# Patient Record
Sex: Male | Born: 1935 | Race: White | Hispanic: No | Marital: Married
Health system: Southern US, Community
[De-identification: ages and names within clinical notes are randomized; demographics above are authoritative.]

## PROBLEM LIST (undated history)

## (undated) DIAGNOSIS — E119 Type 2 diabetes mellitus without complications: Secondary | ICD-10-CM

## (undated) DIAGNOSIS — R296 Repeated falls: Secondary | ICD-10-CM

## (undated) DIAGNOSIS — F419 Anxiety disorder, unspecified: Secondary | ICD-10-CM

## (undated) DIAGNOSIS — N189 Chronic kidney disease, unspecified: Secondary | ICD-10-CM

## (undated) DIAGNOSIS — I10 Essential (primary) hypertension: Secondary | ICD-10-CM

## (undated) DIAGNOSIS — I5020 Unspecified systolic (congestive) heart failure: Secondary | ICD-10-CM

## (undated) DIAGNOSIS — I4891 Unspecified atrial fibrillation: Secondary | ICD-10-CM

---

## 2016-05-30 ENCOUNTER — Other Ambulatory Visit (HOSPITAL_COMMUNITY): Payer: Medicare Other

## 2016-05-30 ENCOUNTER — Inpatient Hospital Stay
Admission: AD | Admit: 2016-05-30 | Discharge: 2016-07-11 | Disposition: E | Payer: Medicare Other | Source: Ambulatory Visit | Attending: Internal Medicine | Admitting: Internal Medicine

## 2016-05-30 ENCOUNTER — Ambulatory Visit (HOSPITAL_COMMUNITY)
Admission: AD | Admit: 2016-05-30 | Discharge: 2016-05-30 | Disposition: A | Discharge status: Home / Self Care | Payer: Medicare Other | Source: Other Acute Inpatient Hospital | Attending: Internal Medicine | Admitting: Internal Medicine

## 2016-05-30 DIAGNOSIS — J9690 Respiratory failure, unspecified, unspecified whether with hypoxia or hypercapnia: Secondary | ICD-10-CM

## 2016-05-30 DIAGNOSIS — R4182 Altered mental status, unspecified: Secondary | ICD-10-CM

## 2016-05-30 DIAGNOSIS — Z931 Gastrostomy status: Secondary | ICD-10-CM

## 2016-05-30 DIAGNOSIS — R609 Edema, unspecified: Secondary | ICD-10-CM

## 2016-05-30 DIAGNOSIS — Z452 Encounter for adjustment and management of vascular access device: Secondary | ICD-10-CM

## 2016-05-30 HISTORY — DX: Unspecified atrial fibrillation: I48.91

## 2016-05-30 HISTORY — DX: Essential (primary) hypertension: I10

## 2016-05-30 HISTORY — DX: Repeated falls: R29.6

## 2016-05-30 HISTORY — DX: Chronic kidney disease, unspecified: N18.9

## 2016-05-30 HISTORY — DX: Unspecified systolic (congestive) heart failure: I50.20

## 2016-05-30 HISTORY — DX: Type 2 diabetes mellitus without complications: E11.9

## 2016-05-30 HISTORY — DX: Anxiety disorder, unspecified: F41.9

## 2016-05-30 LAB — COMPREHENSIVE METABOLIC PANEL
ALK PHOS: 65 U/L (ref 38–126)
ALT: 48 U/L (ref 17–63)
AST: 27 U/L (ref 15–41)
Albumin: 2.4 g/dL — ABNORMAL LOW (ref 3.5–5.0)
Anion gap: 7 (ref 5–15)
BILIRUBIN TOTAL: 1 mg/dL (ref 0.3–1.2)
BUN: 35 mg/dL — AB (ref 6–20)
CO2: 28 mmol/L (ref 22–32)
CREATININE: 1.56 mg/dL — AB (ref 0.61–1.24)
Calcium: 8.3 mg/dL — ABNORMAL LOW (ref 8.9–10.3)
Chloride: 109 mmol/L (ref 101–111)
GFR calc Af Amer: 45 mL/min — ABNORMAL LOW (ref >60)
GFR, EST NON AFRICAN AMERICAN: 39 mL/min — AB (ref >60)
Glucose, Bld: 164 mg/dL — ABNORMAL HIGH (ref 65–99)
Potassium: 3.3 mmol/L — ABNORMAL LOW (ref 3.5–5.1)
Sodium: 144 mmol/L (ref 135–145)
TOTAL PROTEIN: 5.3 g/dL — AB (ref 6.5–8.1)

## 2016-05-30 LAB — BLOOD GAS, ARTERIAL
Acid-Base Excess: 4 mmol/L — ABNORMAL HIGH (ref 0.0–2.0)
BICARBONATE: 27.8 mmol/L (ref 20.0–28.0)
Drawn by: 257701
FIO2: 40
LHR: 14 {breaths}/min
MECHVT: 500 mL
O2 Saturation: 94.7 %
PATIENT TEMPERATURE: 98.6
PEEP: 5 cmH2O
pCO2 arterial: 40.2 mmHg (ref 32.0–48.0)
pH, Arterial: 7.455 — ABNORMAL HIGH (ref 7.350–7.450)
pO2, Arterial: 73.6 mmHg — ABNORMAL LOW (ref 83.0–108.0)

## 2016-05-30 LAB — CBC
HCT: 23.1 % — ABNORMAL LOW (ref 39.0–52.0)
Hemoglobin: 7.7 g/dL — ABNORMAL LOW (ref 13.0–17.0)
MCH: 30.6 pg (ref 26.0–34.0)
MCHC: 33.3 g/dL (ref 30.0–36.0)
MCV: 91.7 fL (ref 78.0–100.0)
Platelets: 245 10*3/uL (ref 150–400)
RBC: 2.52 MIL/uL — ABNORMAL LOW (ref 4.22–5.81)
RDW: 15.1 % (ref 11.5–15.5)
WBC: 10.2 10*3/uL (ref 4.0–10.5)

## 2016-05-31 LAB — TSH: TSH: 0.116 u[IU]/mL — AB (ref 0.350–4.500)

## 2016-05-31 LAB — URINALYSIS, ROUTINE W REFLEX MICROSCOPIC
BILIRUBIN URINE: NEGATIVE
Glucose, UA: NEGATIVE mg/dL
KETONES UR: 5 mg/dL — AB
NITRITE: NEGATIVE
PROTEIN: 30 mg/dL — AB
SPECIFIC GRAVITY, URINE: 1.018 (ref 1.005–1.030)
pH: 5 (ref 5.0–8.0)

## 2016-06-01 LAB — POTASSIUM: Potassium: 3.2 mmol/L — ABNORMAL LOW (ref 3.5–5.1)

## 2016-06-01 LAB — URINE CULTURE: CULTURE: NO GROWTH

## 2016-06-02 LAB — POTASSIUM: POTASSIUM: 3.2 mmol/L — AB (ref 3.5–5.1)

## 2016-06-03 LAB — URINALYSIS, ROUTINE W REFLEX MICROSCOPIC
BILIRUBIN URINE: NEGATIVE
GLUCOSE, UA: 50 mg/dL — AB
Ketones, ur: NEGATIVE mg/dL
NITRITE: NEGATIVE
Protein, ur: 30 mg/dL — AB
Specific Gravity, Urine: 1.012 (ref 1.005–1.030)
pH: 5 (ref 5.0–8.0)

## 2016-06-03 LAB — POTASSIUM: Potassium: 3.5 mmol/L (ref 3.5–5.1)

## 2016-06-04 ENCOUNTER — Encounter: Payer: Self-pay | Admitting: Pulmonary Disease

## 2016-06-04 DIAGNOSIS — Z930 Tracheostomy status: Secondary | ICD-10-CM

## 2016-06-04 DIAGNOSIS — J9601 Acute respiratory failure with hypoxia: Secondary | ICD-10-CM

## 2016-06-04 DIAGNOSIS — R4020 Unspecified coma: Secondary | ICD-10-CM

## 2016-06-04 DIAGNOSIS — R0902 Hypoxemia: Secondary | ICD-10-CM | POA: Diagnosis not present

## 2016-06-04 NOTE — Consult Note (Signed)
Name: Ryan Griffin MRN: [id number] DOB: 10-02-1929    ADMISSION DATE:  05/27/2016 CONSULTATION DATE:  06/04/16  REFERRING MD :  Dr. Sharyon MedicusHijazi   CHIEF COMPLAINT:  Respiratory Failure    HISTORY OF PRESENT ILLNESS:  82 y/o M with a PMH of anxiety, HTN, AF (previously on anticoagulation), systolic CHF, CKD, DM and recurrent falls who was admitted to [facility] after a mechanical fall suffering a scalp hematoma, laceration fo the left eye and closed odontoid fracture.  He was seen by NSGY and they opted for conservative management of the odontoid fracture with a C-Collar.  Hospital course was complicated by difficult to control pain and agitated delirium. He was treated with muscle relaxants and haldol.  Course further complicated by a PEA arrest requiring ALCS and intubation.  HE was transferred to ICU.  CT of the head was reportedly negative at that time. He developed shock requiring vasopressors.  There was concern for possible aspiration PNA and he was treated with Unasyn.  ECHO showed moderate AS which was a new finding.  He slowly improved and passed an SBT on 12/13 but failed extubation and was reintubated.  He completed 10 days of antibiotics for pneumonia and E-Coli UTI.  The patient was ultimately trached and a PEG was placed.  He was subsequently transferred to [facility] [place] on 12/21 for further vent weaning.    PCCM consulted for evaluation.   PAST MEDICAL HISTORY :   has a past medical history of Anxiety; Atrial fibrillation (HCC); CKD (chronic kidney disease); Diabetes (HCC); Hypertension; Recurrent falls; and Systolic CHF (HCC).   has no past surgical history on file.  MEDICATIONS: PRIOR TO ADMIT Baclofen  Lasix  Klonopin  Norvasc Cefdinir  Lantus Venlafaxine  Seroquel  Propofol  Timolol  ALLERGIES:  Testosterone  Cosopt  Meperidine Procainamide Promethazine  Tylenol  Darvocet  FAMILY HISTORY:  family history is not on file. > unable to obtain.    SOCIAL HISTORY:    REVIEW OF SYSTEMS:  Unable to complete as patient is altered on mechanical ventilation.  Information obtained from previous documentation and staff at bedside.   SUBJECTIVE:  Failed weaning this AM, less responsive.  VITAL SIGNS: BP: ()/()  Arterial Line BP: ()/()  HR 87, RR 16, BP 148/76 and sat of 95% on A/C.  PHYSICAL EXAMINATION: General:  Acute on chronically ill appearing male, unresponsive (unsure of baseline here). Neuro:  Unresponsive, does not move any ext to command or pain. HEENT:  Traumatic skull with multiple bruises, c-collar in place. Cardiovascular:  RRR, Nl S1/S2, -M/R/G. Lungs:  Coarse BS diffusely Abdomen:  Soft, NT, ND and +BS. Musculoskeletal:  -edema and -tenderness. Skin:  Intact   Last Labs    Recent Labs Lab 05/16/2016 1930 06/01/16 0530 06/02/16 0559 06/03/16 0500  NA 144  --   --   --   K 3.3* 3.2* 3.2* 3.5  CL 109  --   --   --   CO2 28  --   --   --   BUN 35*  --   --   --   CREATININE 1.56*  --   --   --   GLUCOSE 164*  --   --   --       Last Labs    Recent Labs Lab 05/17/2016 1930  HGB 7.7*  HCT 23.1*  WBC 10.2  PLT 245     Imaging Results (Last 48 hours)  No results found.  ASSESSMENT / PLAN:  82 year old male with PMH of CHF, a-fib and diabetes who presents from outside facility after suffering a fall (evidently a recurrent issue) who has CKD with unknown baseline.  After fall, noted to have multiple fractures (namely odontoid fracture) and NS elected conservative management with a  c-collar.  Patient was on multiple muscle relaxants and narcotics.  Developed cardiac arrest and was intubated.  Subsequently developed UTI and with failure to extubate, patient was trached and [facility].  I reviewed last CXR from 12/21 myself, pulmonary edema noted.  Trach in place but appears positional.  Discussed with RT and PCCM-NP.  Acute respiratory failure:             - Continue  weaning, target is 12 hours of high PS today but failed with 3 hours only.             - Lasix to keep dry.             - Minimize sedation as able  Trach status:             - Maintain current size             - Do not downsize or use uncuffed.  Hypoxemia:             - Titrate O2 for sat of 88-92%  Comatose:             - May need neurology to evaluate and prognosticate             - Consider EEG or CT, will defer to primary  GOC:             - May consider involvement of palliative care as I believe care provided here is futile  Patient seen and examined, agree with above note.  I dictated the care and orders written for this patient under my direction.  Alyson ReedyWesam G Najae Rathert, MD [phone]   06/04/2016, 4:07 PM

## 2016-06-05 LAB — URINE CULTURE: Culture: >=100000 — AB

## 2016-06-06 LAB — CBC
HCT: 24.3 % — ABNORMAL LOW (ref 39.0–52.0)
HEMATOCRIT: 24.8 % — AB (ref 39.0–52.0)
HEMOGLOBIN: 7.8 g/dL — AB (ref 13.0–17.0)
Hemoglobin: 7.7 g/dL — ABNORMAL LOW (ref 13.0–17.0)
MCH: 29 pg (ref 26.0–34.0)
MCH: 29.8 pg (ref 26.0–34.0)
MCHC: 31.5 g/dL (ref 30.0–36.0)
MCHC: 31.7 g/dL (ref 30.0–36.0)
MCV: 92.2 fL (ref 78.0–100.0)
MCV: 94.2 fL (ref 78.0–100.0)
PLATELETS: 303 10*3/uL (ref 150–400)
Platelets: 281 10*3/uL (ref 150–400)
RBC: 2.69 MIL/uL — ABNORMAL LOW (ref 4.22–5.81)
RBC: 2.58 MIL/uL — AB (ref 4.22–5.81)
RDW: 15.1 % (ref 11.5–15.5)
RDW: 15.6 % — ABNORMAL HIGH (ref 11.5–15.5)
WBC: 7.4 10*3/uL (ref 4.0–10.5)
WBC: 10 10*3/uL (ref 4.0–10.5)

## 2016-06-06 LAB — BASIC METABOLIC PANEL
Anion gap: 12 (ref 5–15)
BUN: 79 mg/dL — ABNORMAL HIGH (ref 6–20)
CHLORIDE: 102 mmol/L (ref 101–111)
CO2: 28 mmol/L (ref 22–32)
CREATININE: 2.16 mg/dL — AB (ref 0.61–1.24)
Calcium: 8.8 mg/dL — ABNORMAL LOW (ref 8.9–10.3)
GFR calc non Af Amer: 26 mL/min — ABNORMAL LOW (ref >60)
GFR, EST AFRICAN AMERICAN: 31 mL/min — AB (ref >60)
GLUCOSE: 397 mg/dL — AB (ref 65–99)
Potassium: 3.6 mmol/L (ref 3.5–5.1)
Sodium: 142 mmol/L (ref 135–145)

## 2016-06-07 LAB — CBC
HCT: 23.4 % — ABNORMAL LOW (ref 39.0–52.0)
HCT: 20.3 % — ABNORMAL LOW (ref 39.0–52.0)
Hemoglobin: 7.3 g/dL — ABNORMAL LOW (ref 13.0–17.0)
Hemoglobin: 6.4 g/dL — CL (ref 13.0–17.0)
MCH: 29.3 pg (ref 26.0–34.0)
MCH: 28.7 pg (ref 26.0–34.0)
MCHC: 31.2 g/dL (ref 30.0–36.0)
MCHC: 31.5 g/dL (ref 30.0–36.0)
MCV: 94 fL (ref 78.0–100.0)
MCV: 91 fL (ref 78.0–100.0)
PLATELETS: 297 10*3/uL (ref 150–400)
PLATELETS: 287 10*3/uL (ref 150–400)
RBC: 2.49 MIL/uL — AB (ref 4.22–5.81)
RBC: 2.23 MIL/uL — ABNORMAL LOW (ref 4.22–5.81)
RDW: 15.4 % (ref 11.5–15.5)
RDW: 15.1 % (ref 11.5–15.5)
WBC: 7.9 10*3/uL (ref 4.0–10.5)
WBC: 6.2 10*3/uL (ref 4.0–10.5)

## 2016-06-07 LAB — BASIC METABOLIC PANEL
Anion gap: 12 (ref 5–15)
BUN: 94 mg/dL — AB (ref 6–20)
CO2: 27 mmol/L (ref 22–32)
CREATININE: 2.51 mg/dL — AB (ref 0.61–1.24)
Calcium: 8.5 mg/dL — ABNORMAL LOW (ref 8.9–10.3)
Chloride: 107 mmol/L (ref 101–111)
GFR calc Af Amer: 25 mL/min — ABNORMAL LOW (ref >60)
GFR, EST NON AFRICAN AMERICAN: 22 mL/min — AB (ref >60)
Glucose, Bld: 237 mg/dL — ABNORMAL HIGH (ref 65–99)
Potassium: 3.2 mmol/L — ABNORMAL LOW (ref 3.5–5.1)
SODIUM: 146 mmol/L — AB (ref 135–145)

## 2016-06-08 LAB — CBC
HEMATOCRIT: 23.3 % — AB (ref 39.0–52.0)
HEMOGLOBIN: 7.4 g/dL — AB (ref 13.0–17.0)
MCH: 29.1 pg (ref 26.0–34.0)
MCHC: 31.8 g/dL (ref 30.0–36.0)
MCV: 91.7 fL (ref 78.0–100.0)
Platelets: 341 10*3/uL (ref 150–400)
RBC: 2.54 MIL/uL — ABNORMAL LOW (ref 4.22–5.81)
RDW: 15.3 % (ref 11.5–15.5)
WBC: 8.3 10*3/uL (ref 4.0–10.5)

## 2016-06-08 LAB — BASIC METABOLIC PANEL
Anion gap: 11 (ref 5–15)
BUN: 109 mg/dL — AB (ref 6–20)
CHLORIDE: 108 mmol/L (ref 101–111)
CO2: 28 mmol/L (ref 22–32)
CREATININE: 2.84 mg/dL — AB (ref 0.61–1.24)
Calcium: 8.9 mg/dL (ref 8.9–10.3)
GFR calc Af Amer: 22 mL/min — ABNORMAL LOW (ref >60)
GFR calc non Af Amer: 19 mL/min — ABNORMAL LOW (ref >60)
GLUCOSE: 190 mg/dL — AB (ref 65–99)
Potassium: 3.3 mmol/L — ABNORMAL LOW (ref 3.5–5.1)
SODIUM: 147 mmol/L — AB (ref 135–145)

## 2016-06-09 LAB — CBC
HEMATOCRIT: 23.7 % — AB (ref 39.0–52.0)
Hemoglobin: 7.3 g/dL — ABNORMAL LOW (ref 13.0–17.0)
MCH: 29.4 pg (ref 26.0–34.0)
MCHC: 30.8 g/dL (ref 30.0–36.0)
MCV: 95.6 fL (ref 78.0–100.0)
Platelets: 313 10*3/uL (ref 150–400)
RBC: 2.48 MIL/uL — ABNORMAL LOW (ref 4.22–5.81)
RDW: 16 % — AB (ref 11.5–15.5)
WBC: 8.7 10*3/uL (ref 4.0–10.5)

## 2016-06-09 LAB — BASIC METABOLIC PANEL
Anion gap: 8 (ref 5–15)
BUN: 105 mg/dL — ABNORMAL HIGH (ref 6–20)
CO2: 31 mmol/L (ref 22–32)
CREATININE: 2.64 mg/dL — AB (ref 0.61–1.24)
Calcium: 8.7 mg/dL — ABNORMAL LOW (ref 8.9–10.3)
Chloride: 110 mmol/L (ref 101–111)
GFR calc non Af Amer: 21 mL/min — ABNORMAL LOW (ref >60)
GFR, EST AFRICAN AMERICAN: 24 mL/min — AB (ref >60)
Glucose, Bld: 146 mg/dL — ABNORMAL HIGH (ref 65–99)
Potassium: 3.6 mmol/L (ref 3.5–5.1)
SODIUM: 149 mmol/L — AB (ref 135–145)

## 2016-06-10 LAB — BASIC METABOLIC PANEL
Anion gap: 10 (ref 5–15)
BUN: 93 mg/dL — ABNORMAL HIGH (ref 6–20)
CALCIUM: 8.8 mg/dL — AB (ref 8.9–10.3)
CO2: 30 mmol/L (ref 22–32)
CREATININE: 2.43 mg/dL — AB (ref 0.61–1.24)
Chloride: 111 mmol/L (ref 101–111)
GFR calc non Af Amer: 23 mL/min — ABNORMAL LOW (ref >60)
GFR, EST AFRICAN AMERICAN: 27 mL/min — AB (ref >60)
Glucose, Bld: 261 mg/dL — ABNORMAL HIGH (ref 65–99)
Potassium: 3.2 mmol/L — ABNORMAL LOW (ref 3.5–5.1)
SODIUM: 151 mmol/L — AB (ref 135–145)

## 2016-06-10 LAB — CBC
HEMATOCRIT: 21.9 % — AB (ref 39.0–52.0)
Hemoglobin: 6.8 g/dL — CL (ref 13.0–17.0)
MCH: 30 pg (ref 26.0–34.0)
MCHC: 31.1 g/dL (ref 30.0–36.0)
MCV: 96.5 fL (ref 78.0–100.0)
Platelets: 265 10*3/uL (ref 150–400)
RBC: 2.27 MIL/uL — ABNORMAL LOW (ref 4.22–5.81)
RDW: 16.3 % — ABNORMAL HIGH (ref 11.5–15.5)
WBC: 7.5 10*3/uL (ref 4.0–10.5)

## 2016-06-10 LAB — ABO/RH: ABO/RH(D): O POS

## 2016-06-10 LAB — PREPARE RBC (CROSSMATCH)

## 2016-06-10 DEATH — deceased

## 2016-06-11 ENCOUNTER — Other Ambulatory Visit (HOSPITAL_COMMUNITY): Payer: Medicare Other

## 2016-06-11 LAB — BASIC METABOLIC PANEL
Anion gap: 6 (ref 5–15)
Anion gap: 5 (ref 5–15)
BUN: 69 mg/dL — AB (ref 6–20)
BUN: 71 mg/dL — AB (ref 6–20)
CHLORIDE: 110 mmol/L (ref 101–111)
CHLORIDE: 113 mmol/L — AB (ref 101–111)
CO2: 27 mmol/L (ref 22–32)
CO2: 29 mmol/L (ref 22–32)
Calcium: 8 mg/dL — ABNORMAL LOW (ref 8.9–10.3)
Calcium: 8.3 mg/dL — ABNORMAL LOW (ref 8.9–10.3)
Creatinine, Ser: 1.99 mg/dL — ABNORMAL HIGH (ref 0.61–1.24)
Creatinine, Ser: 1.94 mg/dL — ABNORMAL HIGH (ref 0.61–1.24)
GFR calc Af Amer: 34 mL/min — ABNORMAL LOW (ref >60)
GFR calc Af Amer: 35 mL/min — ABNORMAL LOW (ref >60)
GFR calc non Af Amer: 29 mL/min — ABNORMAL LOW (ref >60)
GFR calc non Af Amer: 30 mL/min — ABNORMAL LOW (ref >60)
Glucose, Bld: 342 mg/dL — ABNORMAL HIGH (ref 65–99)
Glucose, Bld: 130 mg/dL — ABNORMAL HIGH (ref 65–99)
POTASSIUM: 3.2 mmol/L — AB (ref 3.5–5.1)
Potassium: 3.2 mmol/L — ABNORMAL LOW (ref 3.5–5.1)
SODIUM: 147 mmol/L — AB (ref 135–145)
Sodium: 143 mmol/L (ref 135–145)

## 2016-06-11 LAB — TYPE AND SCREEN
Blood Product Expiration Date: 201801082359
ISSUE DATE / TIME: 201801011713
Unit Type and Rh: 5100

## 2016-06-11 LAB — CBC
HCT: 24.4 % — ABNORMAL LOW (ref 39.0–52.0)
HCT: 26.6 % — ABNORMAL LOW (ref 39.0–52.0)
Hemoglobin: 7.6 g/dL — ABNORMAL LOW (ref 13.0–17.0)
Hemoglobin: 8.2 g/dL — ABNORMAL LOW (ref 13.0–17.0)
MCH: 29.2 pg (ref 26.0–34.0)
MCH: 29.6 pg (ref 26.0–34.0)
MCHC: 31.1 g/dL (ref 30.0–36.0)
MCHC: 30.8 g/dL (ref 30.0–36.0)
MCV: 93.8 fL (ref 78.0–100.0)
MCV: 96 fL (ref 78.0–100.0)
PLATELETS: 257 10*3/uL (ref 150–400)
Platelets: 264 10*3/uL (ref 150–400)
RBC: 2.6 MIL/uL — AB (ref 4.22–5.81)
RBC: 2.77 MIL/uL — ABNORMAL LOW (ref 4.22–5.81)
RDW: 16.6 % — ABNORMAL HIGH (ref 11.5–15.5)
RDW: 17.1 % — AB (ref 11.5–15.5)
WBC: 6.1 10*3/uL (ref 4.0–10.5)
WBC: 5.8 10*3/uL (ref 4.0–10.5)

## 2016-06-14 ENCOUNTER — Other Ambulatory Visit (HOSPITAL_COMMUNITY): Payer: Medicare Other

## 2016-06-14 LAB — BASIC METABOLIC PANEL
Anion gap: 7 (ref 5–15)
BUN: 42 mg/dL — ABNORMAL HIGH (ref 6–20)
CALCIUM: 8.2 mg/dL — AB (ref 8.9–10.3)
CHLORIDE: 105 mmol/L (ref 101–111)
CO2: 28 mmol/L (ref 22–32)
CREATININE: 1.56 mg/dL — AB (ref 0.61–1.24)
GFR, EST AFRICAN AMERICAN: 45 mL/min — AB (ref >60)
GFR, EST NON AFRICAN AMERICAN: 39 mL/min — AB (ref >60)
Glucose, Bld: 136 mg/dL — ABNORMAL HIGH (ref 65–99)
Potassium: 4.2 mmol/L (ref 3.5–5.1)
SODIUM: 140 mmol/L (ref 135–145)

## 2016-06-14 LAB — CULTURE, RESPIRATORY W GRAM STAIN

## 2016-06-14 LAB — CULTURE, RESPIRATORY

## 2016-06-18 ENCOUNTER — Other Ambulatory Visit (HOSPITAL_COMMUNITY): Payer: Medicare Other

## 2016-06-18 LAB — CBC
HCT: 28.1 % — ABNORMAL LOW (ref 39.0–52.0)
Hemoglobin: 8.6 g/dL — ABNORMAL LOW (ref 13.0–17.0)
MCH: 28.3 pg (ref 26.0–34.0)
MCHC: 30.6 g/dL (ref 30.0–36.0)
MCV: 92.4 fL (ref 78.0–100.0)
PLATELETS: 242 10*3/uL (ref 150–400)
RBC: 3.04 MIL/uL — AB (ref 4.22–5.81)
RDW: 16.1 % — ABNORMAL HIGH (ref 11.5–15.5)
WBC: 9.9 10*3/uL (ref 4.0–10.5)

## 2016-06-18 LAB — BASIC METABOLIC PANEL
ANION GAP: 11 (ref 5–15)
BUN: 50 mg/dL — ABNORMAL HIGH (ref 6–20)
CALCIUM: 8.3 mg/dL — AB (ref 8.9–10.3)
CO2: 29 mmol/L (ref 22–32)
Chloride: 98 mmol/L — ABNORMAL LOW (ref 101–111)
Creatinine, Ser: 1.81 mg/dL — ABNORMAL HIGH (ref 0.61–1.24)
GFR, EST AFRICAN AMERICAN: 38 mL/min — AB (ref >60)
GFR, EST NON AFRICAN AMERICAN: 33 mL/min — AB (ref >60)
Glucose, Bld: 324 mg/dL — ABNORMAL HIGH (ref 65–99)
POTASSIUM: 3.8 mmol/L (ref 3.5–5.1)
SODIUM: 138 mmol/L (ref 135–145)

## 2016-06-19 ENCOUNTER — Other Ambulatory Visit (HOSPITAL_COMMUNITY): Payer: Medicare Other

## 2016-06-19 LAB — BLOOD GAS, ARTERIAL
ACID-BASE EXCESS: 7.5 mmol/L — AB (ref 0.0–2.0)
BICARBONATE: 30.5 mmol/L — AB (ref 20.0–28.0)
FIO2: 60
LHR: 14 {breaths}/min
MECHVT: 500 mL
O2 SAT: 98.9 %
PEEP/CPAP: 8 cmH2O
PH ART: 7.554 — AB (ref 7.350–7.450)
Patient temperature: 98.6
pCO2 arterial: 34.6 mmHg (ref 32.0–48.0)
pO2, Arterial: 119 mmHg — ABNORMAL HIGH (ref 83.0–108.0)

## 2016-06-21 ENCOUNTER — Other Ambulatory Visit (HOSPITAL_COMMUNITY): Payer: Medicare Other

## 2016-06-21 ENCOUNTER — Inpatient Hospital Stay (HOSPITAL_COMMUNITY)
Admission: AD | Admit: 2016-06-21 | Discharge: 2016-06-21 | Disposition: A | Discharge status: Home / Self Care | Payer: Medicare Other | Source: Ambulatory Visit | Attending: Internal Medicine | Admitting: Internal Medicine

## 2016-06-21 DIAGNOSIS — R4182 Altered mental status, unspecified: Secondary | ICD-10-CM

## 2016-06-21 LAB — BLOOD GAS, ARTERIAL
Acid-Base Excess: 7.1 mmol/L — ABNORMAL HIGH (ref 0.0–2.0)
BICARBONATE: 30.5 mmol/L — AB (ref 20.0–28.0)
FIO2: 100
LHR: 24 {breaths}/min
O2 SAT: 92.2 %
PEEP: 8 cmH2O
PH ART: 7.512 — AB (ref 7.350–7.450)
PO2 ART: 62.5 mmHg — AB (ref 83.0–108.0)
Patient temperature: 98.6
VT: 500 mL
pCO2 arterial: 38.3 mmHg (ref 32.0–48.0)

## 2016-06-21 LAB — BASIC METABOLIC PANEL
ANION GAP: 15 (ref 5–15)
BUN: 67 mg/dL — AB (ref 6–20)
CHLORIDE: 102 mmol/L (ref 101–111)
CO2: 27 mmol/L (ref 22–32)
Calcium: 8.7 mg/dL — ABNORMAL LOW (ref 8.9–10.3)
Creatinine, Ser: 2.02 mg/dL — ABNORMAL HIGH (ref 0.61–1.24)
GFR calc Af Amer: 33 mL/min — ABNORMAL LOW (ref >60)
GFR, EST NON AFRICAN AMERICAN: 29 mL/min — AB (ref >60)
GLUCOSE: 335 mg/dL — AB (ref 65–99)
POTASSIUM: 3.1 mmol/L — AB (ref 3.5–5.1)
Sodium: 144 mmol/L (ref 135–145)

## 2016-06-21 LAB — COMPREHENSIVE METABOLIC PANEL
ALK PHOS: 169 U/L — AB (ref 38–126)
ALT: 40 U/L (ref 17–63)
AST: 44 U/L — AB (ref 15–41)
Albumin: 2 g/dL — ABNORMAL LOW (ref 3.5–5.0)
Anion gap: 15 (ref 5–15)
BUN: 67 mg/dL — AB (ref 6–20)
CALCIUM: 8.7 mg/dL — AB (ref 8.9–10.3)
CHLORIDE: 103 mmol/L (ref 101–111)
CO2: 27 mmol/L (ref 22–32)
CREATININE: 2.06 mg/dL — AB (ref 0.61–1.24)
GFR, EST AFRICAN AMERICAN: 32 mL/min — AB (ref >60)
GFR, EST NON AFRICAN AMERICAN: 28 mL/min — AB (ref >60)
Glucose, Bld: 334 mg/dL — ABNORMAL HIGH (ref 65–99)
Potassium: 3.2 mmol/L — ABNORMAL LOW (ref 3.5–5.1)
Sodium: 145 mmol/L (ref 135–145)
Total Bilirubin: 1.1 mg/dL (ref 0.3–1.2)
Total Protein: 6.2 g/dL — ABNORMAL LOW (ref 6.5–8.1)

## 2016-06-21 LAB — BLOOD CULTURE ID PANEL (REFLEXED)
Acinetobacter baumannii: NOT DETECTED
CANDIDA ALBICANS: NOT DETECTED
CANDIDA PARAPSILOSIS: NOT DETECTED
CANDIDA TROPICALIS: NOT DETECTED
Candida glabrata: NOT DETECTED
Candida krusei: NOT DETECTED
ENTEROBACTERIACEAE SPECIES: NOT DETECTED
Enterobacter cloacae complex: NOT DETECTED
Enterococcus species: NOT DETECTED
Escherichia coli: NOT DETECTED
HAEMOPHILUS INFLUENZAE: NOT DETECTED
KLEBSIELLA OXYTOCA: NOT DETECTED
KLEBSIELLA PNEUMONIAE: NOT DETECTED
Listeria monocytogenes: NOT DETECTED
METHICILLIN RESISTANCE: DETECTED — AB
NEISSERIA MENINGITIDIS: NOT DETECTED
PROTEUS SPECIES: NOT DETECTED
Pseudomonas aeruginosa: NOT DETECTED
SERRATIA MARCESCENS: NOT DETECTED
STAPHYLOCOCCUS AUREUS BCID: NOT DETECTED
STAPHYLOCOCCUS SPECIES: DETECTED — AB
STREPTOCOCCUS PYOGENES: NOT DETECTED
STREPTOCOCCUS SPECIES: NOT DETECTED
Streptococcus agalactiae: NOT DETECTED
Streptococcus pneumoniae: NOT DETECTED

## 2016-06-21 LAB — CBC
HEMATOCRIT: 25.7 % — AB (ref 39.0–52.0)
HEMOGLOBIN: 8 g/dL — AB (ref 13.0–17.0)
MCH: 28.7 pg (ref 26.0–34.0)
MCHC: 31.1 g/dL (ref 30.0–36.0)
MCV: 92.1 fL (ref 78.0–100.0)
PLATELETS: 268 10*3/uL (ref 150–400)
RBC: 2.79 MIL/uL — AB (ref 4.22–5.81)
RDW: 16.1 % — ABNORMAL HIGH (ref 11.5–15.5)
WBC: 11.7 10*3/uL — AB (ref 4.0–10.5)

## 2016-06-21 LAB — CK TOTAL AND CKMB (NOT AT ARMC)
CK TOTAL: 70 U/L (ref 49–397)
CK, MB: 2.9 ng/mL (ref 0.5–5.0)
Relative Index: INVALID (ref 0.0–2.5)

## 2016-06-21 LAB — MAGNESIUM: Magnesium: 2 mg/dL (ref 1.7–2.4)

## 2016-06-21 LAB — TROPONIN I: TROPONIN I: 0.09 ng/mL — AB (ref <0.03)

## 2016-06-21 NOTE — Procedures (Signed)
EEG report  Referring physician- Carron Curieli Hijazi  History/Indication- [id number] male with recent history of head injury and cardiac arrest currently intubated  Medications Ativan 2 mg Oxycodone Clonazepam Seroquel Humalog Furosemide Lantus Insulin  Technical This is a multichannel digital EEG recording using the international 10-20 placement system.  Description of the recording No significant posterior dominant rhythm seen EEG comprises generalized delta slowing with focal slowing in left frontal region  Epileptiform features not seen  Impression  EEG is abnormal and findings are suggestive of Moderate to severe generalized cerebral dysfunction Left frontal focal cerebral dysfunction Epileptiform features not seen

## 2016-06-21 NOTE — Progress Notes (Signed)
EEG Completed; Results Pending  

## 2016-06-23 LAB — CULTURE, RESPIRATORY W GRAM STAIN

## 2016-06-23 LAB — CULTURE, BLOOD (ROUTINE X 2)

## 2016-06-25 LAB — CULTURE, BLOOD (ROUTINE X 2): Culture: NO GROWTH

## 2016-07-11 DEATH — deceased

## 2017-11-03 IMAGING — DX DG CHEST 1V PORT
1 series · 1 of 1 positions shown · non-contrast
Comparison: 06/11/2016

CLINICAL DATA: Follow-up edema

EXAM:
PORTABLE CHEST 1 VIEW

[chest ap]
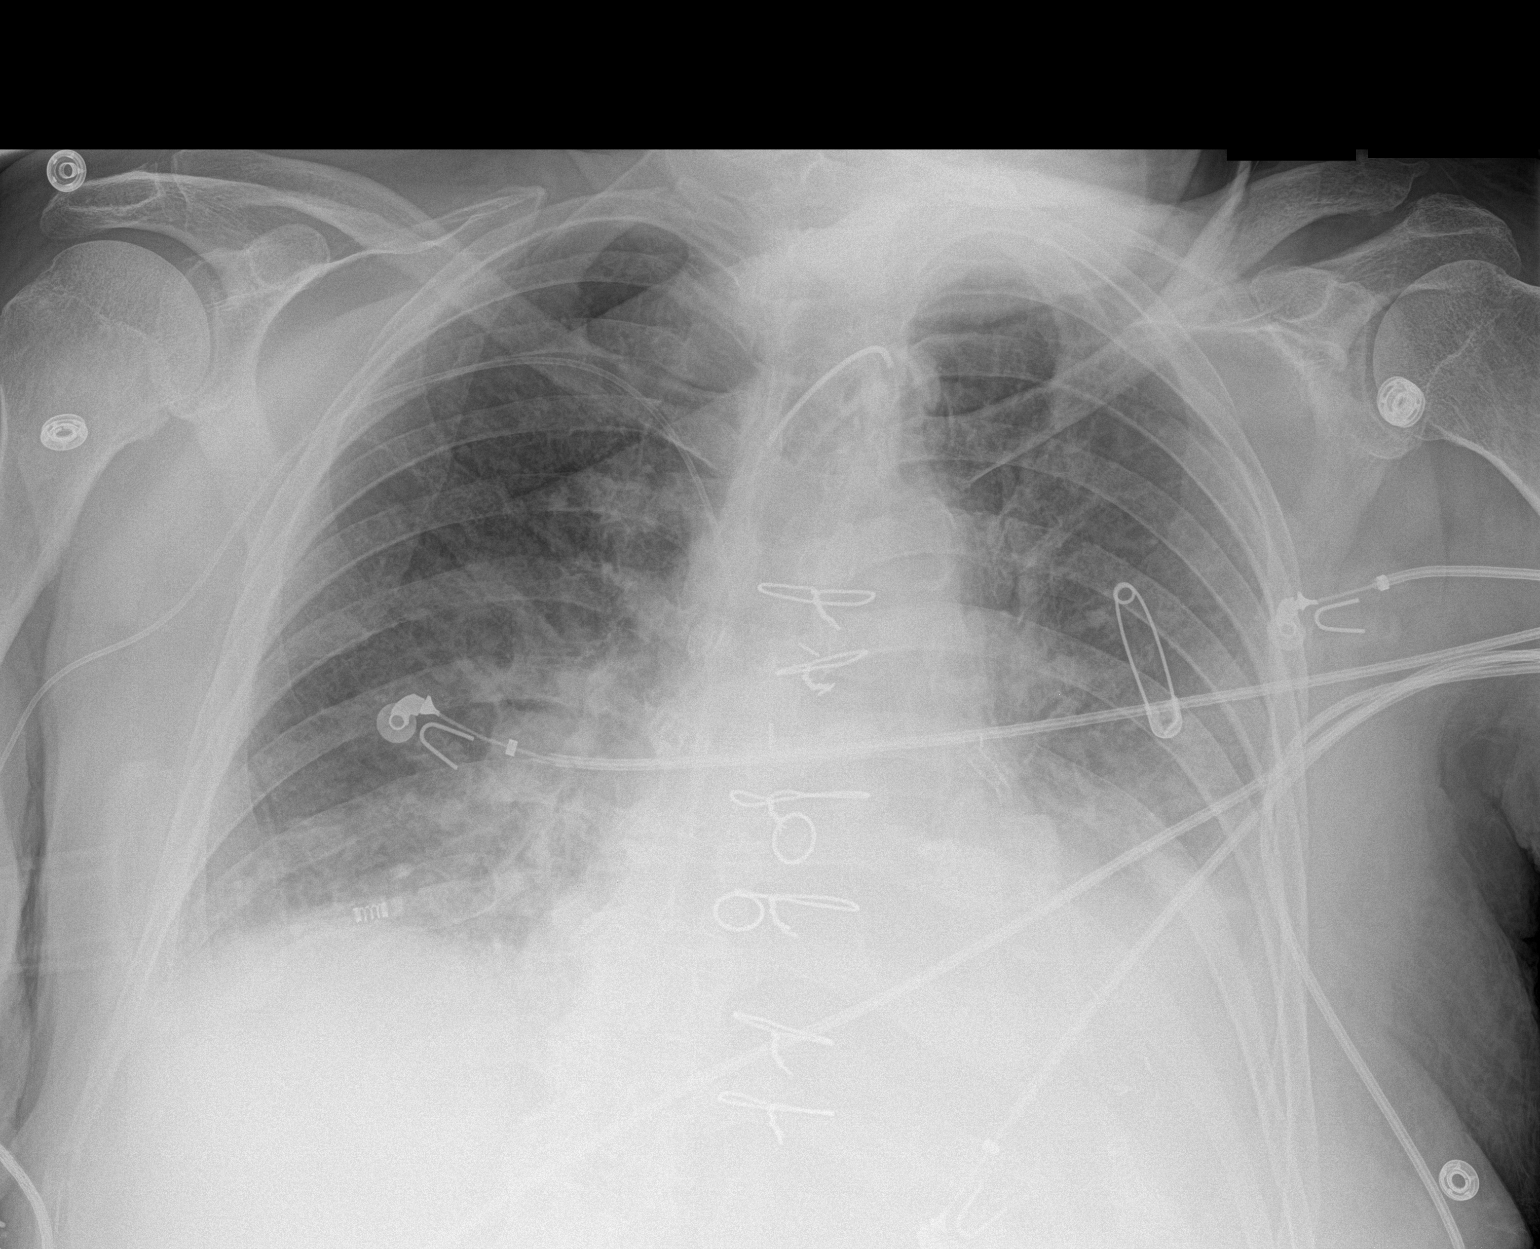

[1 of 1 positions shown; findings below may reference images not displayed]

FINDINGS: Tracheostomy tube and right PICC line remain in place, unchanged.
Prior CABG. Mild perihilar and lower lobe airspace opacities, likely
edema. Suspect small layering effusions. Heart is borderline
enlarged.
IMPRESSION: Continued perihilar and lower lobe opacities, likely edema/ CHF.
This may have improved slightly since prior study.

Small effusions.

## 2017-11-08 IMAGING — CT CT HEAD W/O CM
3 of 4 series · 13 of 47 positions shown, 15 images · non-contrast
Comparison: CT scan of May 21, 2016.

CLINICAL DATA: Altered mental status.

EXAM:
CT HEAD WITHOUT CONTRAST
TECHNIQUE: Contiguous axial images were obtained from the base of the skull
through the vertex without intravenous contrast.

[Series 2: head without · axial · non-contrast · 0.42mm/px · z∈[-105,+15]mm · 7 of 32 slices shown, 9 images]
[im 4/32  brain]
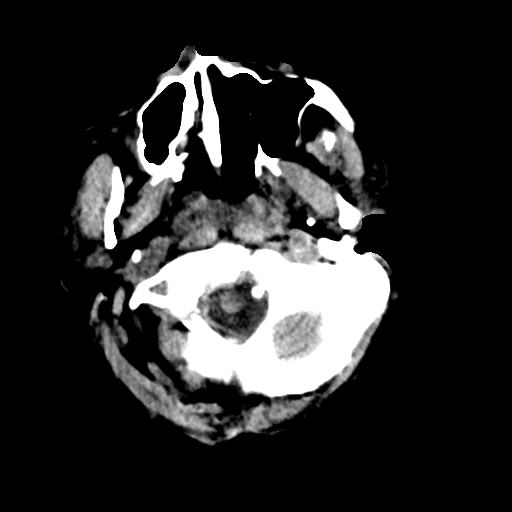
[im 4/32  bone]
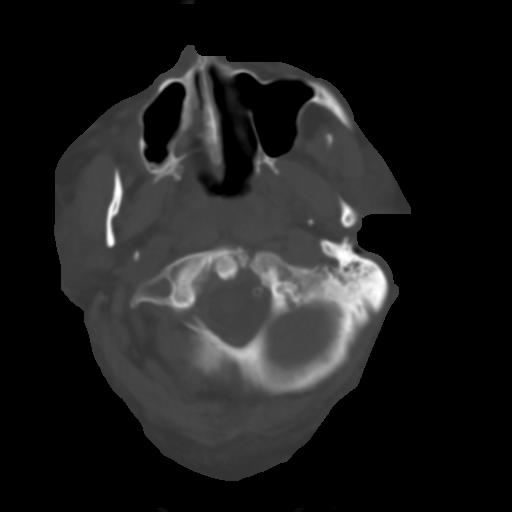
[im 8/32  brain]
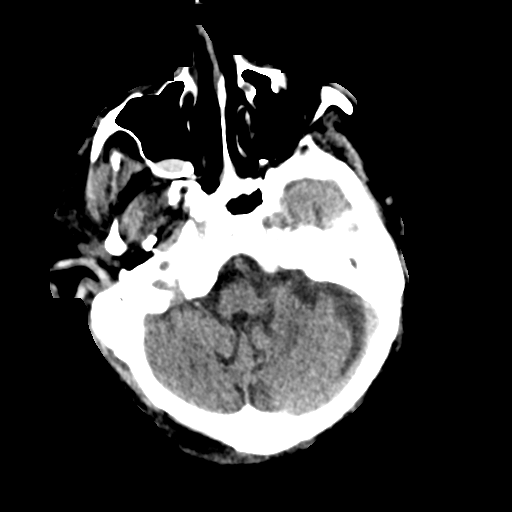
[im 12/32  brain]
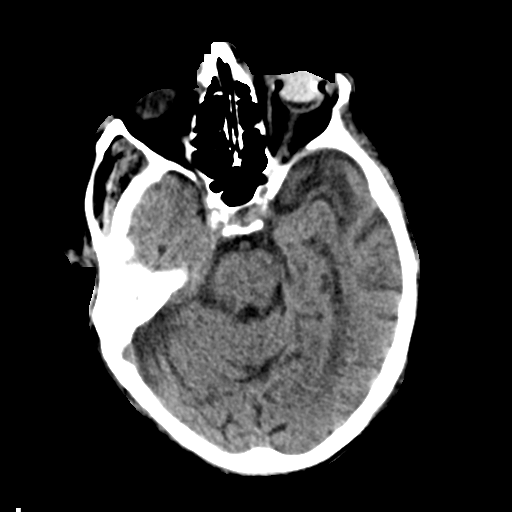
[im 16/32  brain]
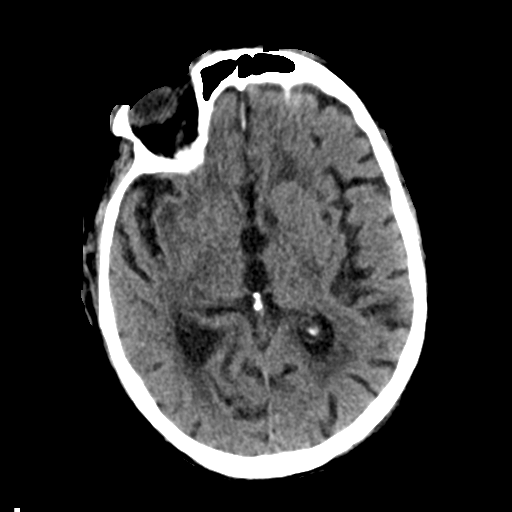
[im 20/32  brain]
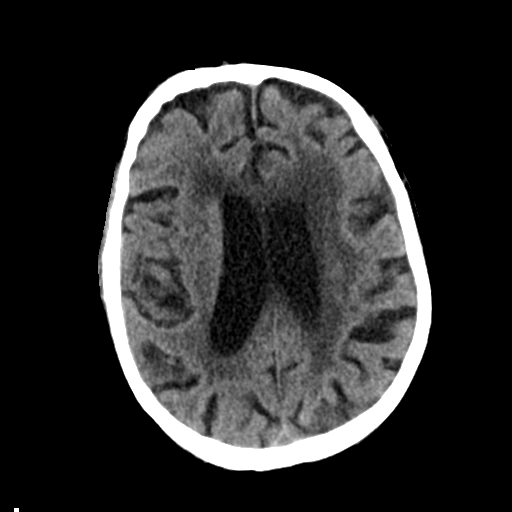
[im 20/32  bone]
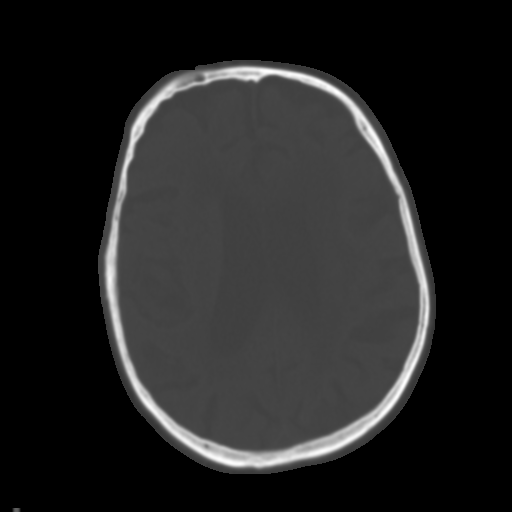
[im 24/32  brain]
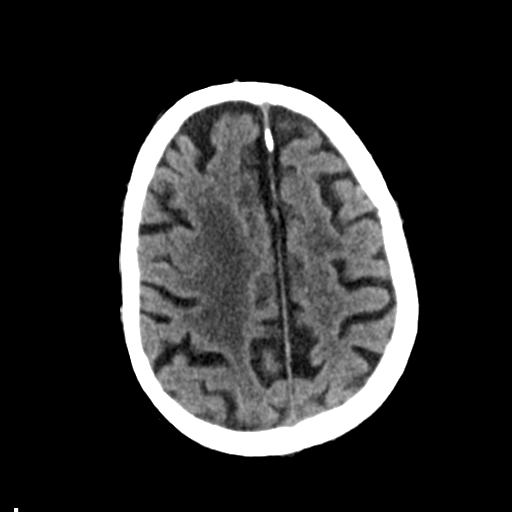
[im 28/32  brain]
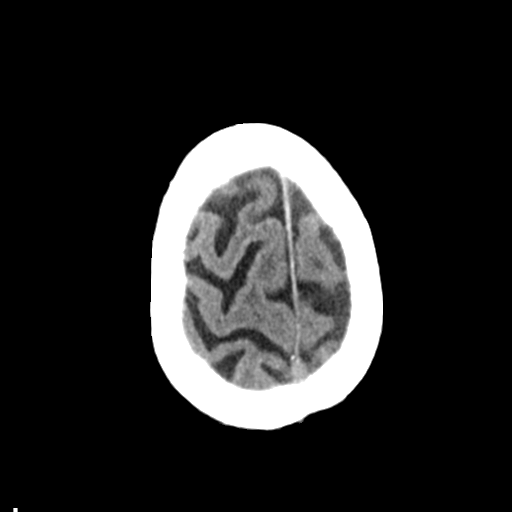

[Series 4: head without cor · coronal · non-contrast · 0.31mm/px · 3 of 65 slices shown]
[im 22/65  brain]
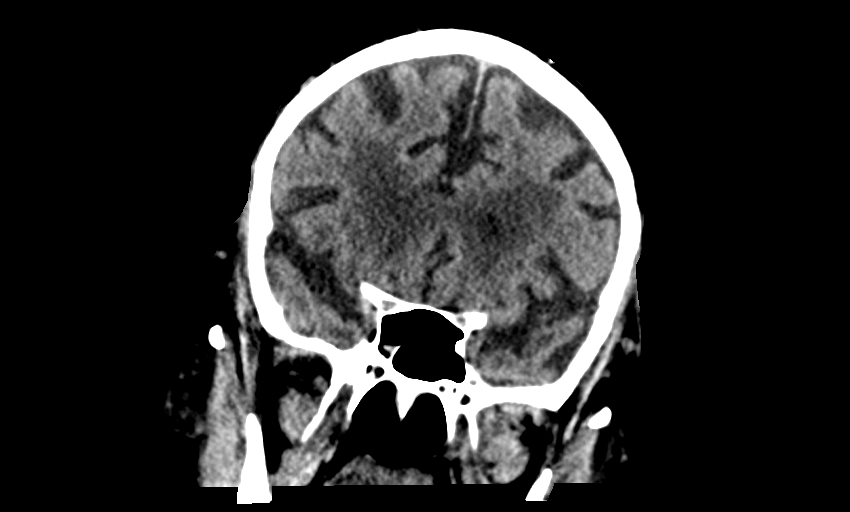
[im 29/65  brain]
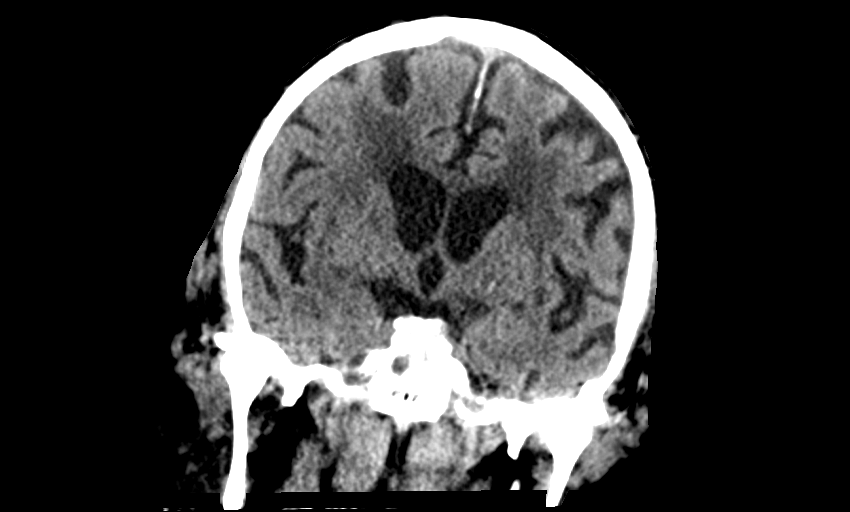
[im 36/65  brain]
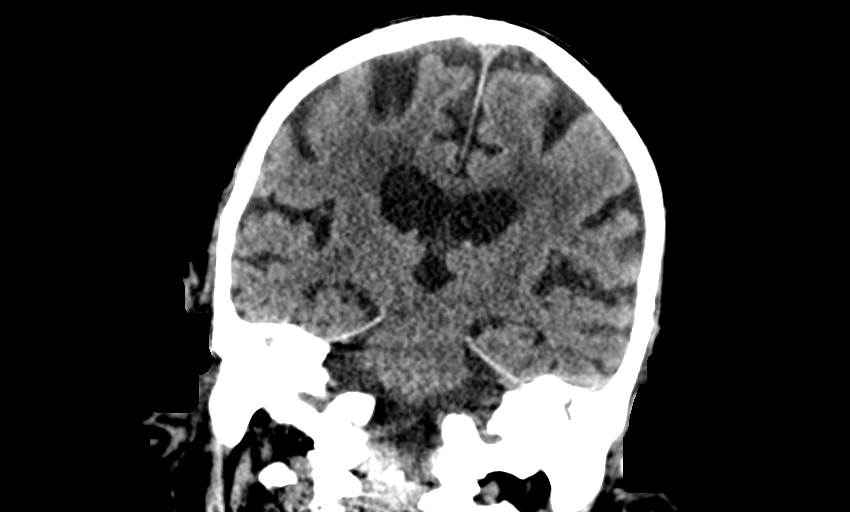

[Series 5: head without sag · sagittal · non-contrast · 0.31mm/px · 3 of 67 slices shown]
[im 23/67  brain]
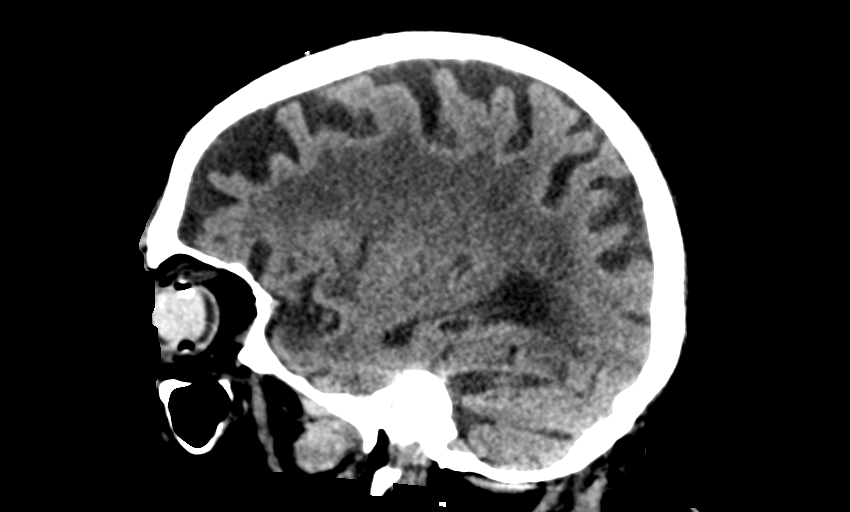
[im 34/67  brain]
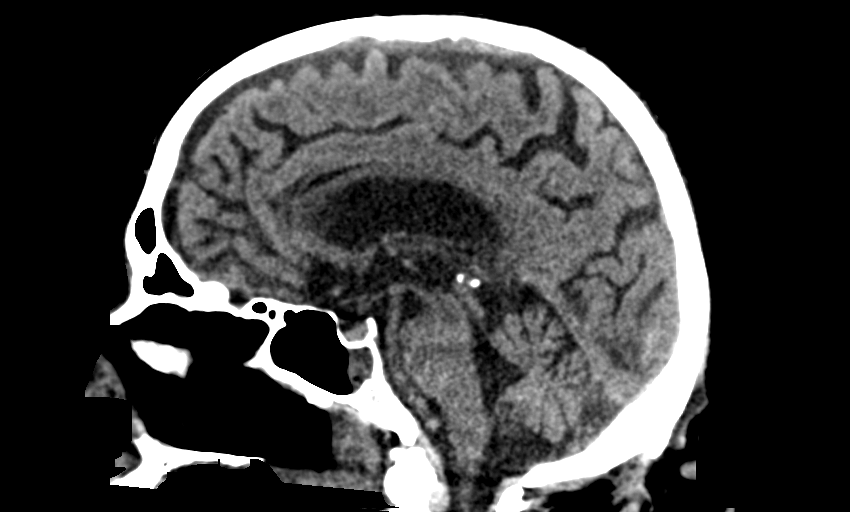
[im 45/67  brain]
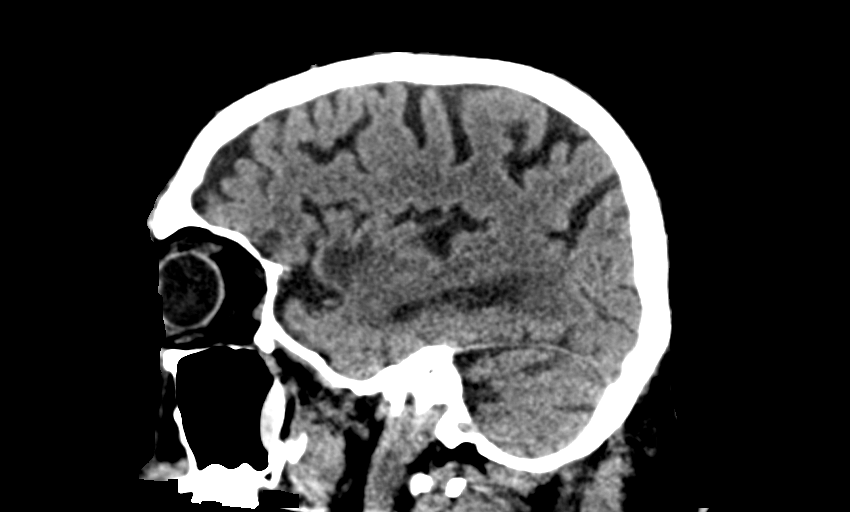

[13 of 47 positions shown; findings below may reference images not displayed]

FINDINGS: Brain: Mild diffuse cortical atrophy. Mild chronic ischemic white
matter disease. No mass effect or midline shift is noted.
Ventricular size is within normal limits. There is no evidence of
mass lesion, hemorrhage or acute infarction.

Vascular: Atherosclerosis of carotid siphons is noted.

Skull: Normal. Negative for fracture or focal lesion.

Sinuses/Orbits: Sphenoid sinusitis is noted. Mucous retention cyst
is noted in right maxillary sinus. Fluid is noted in the mastoid air
cells bilaterally.

Other: None.
IMPRESSION: Mild chronic ischemic white matter disease. Mild diffuse cortical
atrophy. No acute intracranial abnormality seen.
